# Patient Record
Sex: Male | Born: 1965 | Race: White | Hispanic: No | Marital: Married | Smoking: Former smoker
Health system: Southern US, Community
[De-identification: ages and names within clinical notes are randomized; demographics above are authoritative.]

## PROBLEM LIST (undated history)

## (undated) DIAGNOSIS — E119 Type 2 diabetes mellitus without complications: Secondary | ICD-10-CM

## (undated) HISTORY — PX: SHOULDER SURGERY: SHX246

---

## 2014-01-09 ENCOUNTER — Ambulatory Visit (INDEPENDENT_AMBULATORY_CARE_PROVIDER_SITE_OTHER): Payer: Self-pay

## 2014-01-09 ENCOUNTER — Other Ambulatory Visit: Payer: Self-pay | Admitting: Emergency Medicine

## 2014-01-09 DIAGNOSIS — M25469 Effusion, unspecified knee: Secondary | ICD-10-CM

## 2014-01-09 DIAGNOSIS — R52 Pain, unspecified: Secondary | ICD-10-CM

## 2014-11-17 ENCOUNTER — Emergency Department (INDEPENDENT_AMBULATORY_CARE_PROVIDER_SITE_OTHER)
Admission: EM | Admit: 2014-11-17 | Discharge: 2014-11-17 | Disposition: A | Discharge status: Home / Self Care | Payer: Worker's Compensation | Source: Home / Self Care | Attending: Family Medicine | Admitting: Family Medicine

## 2014-11-17 ENCOUNTER — Emergency Department (INDEPENDENT_AMBULATORY_CARE_PROVIDER_SITE_OTHER): Payer: Worker's Compensation

## 2014-11-17 ENCOUNTER — Encounter: Payer: Self-pay | Admitting: Emergency Medicine

## 2014-11-17 DIAGNOSIS — M12811 Other specific arthropathies, not elsewhere classified, right shoulder: Secondary | ICD-10-CM

## 2014-11-17 DIAGNOSIS — S43401A Unspecified sprain of right shoulder joint, initial encounter: Secondary | ICD-10-CM

## 2014-11-17 MED ORDER — MELOXICAM 15 MG PO TABS: 15.0000 mg | ORAL_TABLET | Freq: Every day | ORAL | Status: DC

## 2014-11-17 MED ORDER — TRAMADOL HCL 50 MG PO TABS: ORAL_TABLET | ORAL | Status: DC
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
## 2014-11-17 NOTE — Discharge Instructions (Signed)
Apply ice pack for 20 to 30 minutes, 3 to 4 times daily  Continue until pain decreases.  Wear shoulder immobilizer.

## 2014-11-17 NOTE — ED Notes (Signed)
Fell at work today and injured right shoulder trying to break the fall.

## 2015-05-12 IMAGING — CR DG KNEE COMPLETE 4+V*R*
4 series · 4 of 4 positions shown · non-contrast
Comparison: None.

CLINICAL DATA: Medial knee pain post injury yesterday

EXAM:
RIGHT KNEE - COMPLETE 4+ VIEW

[view not recorded (1 of 4)]
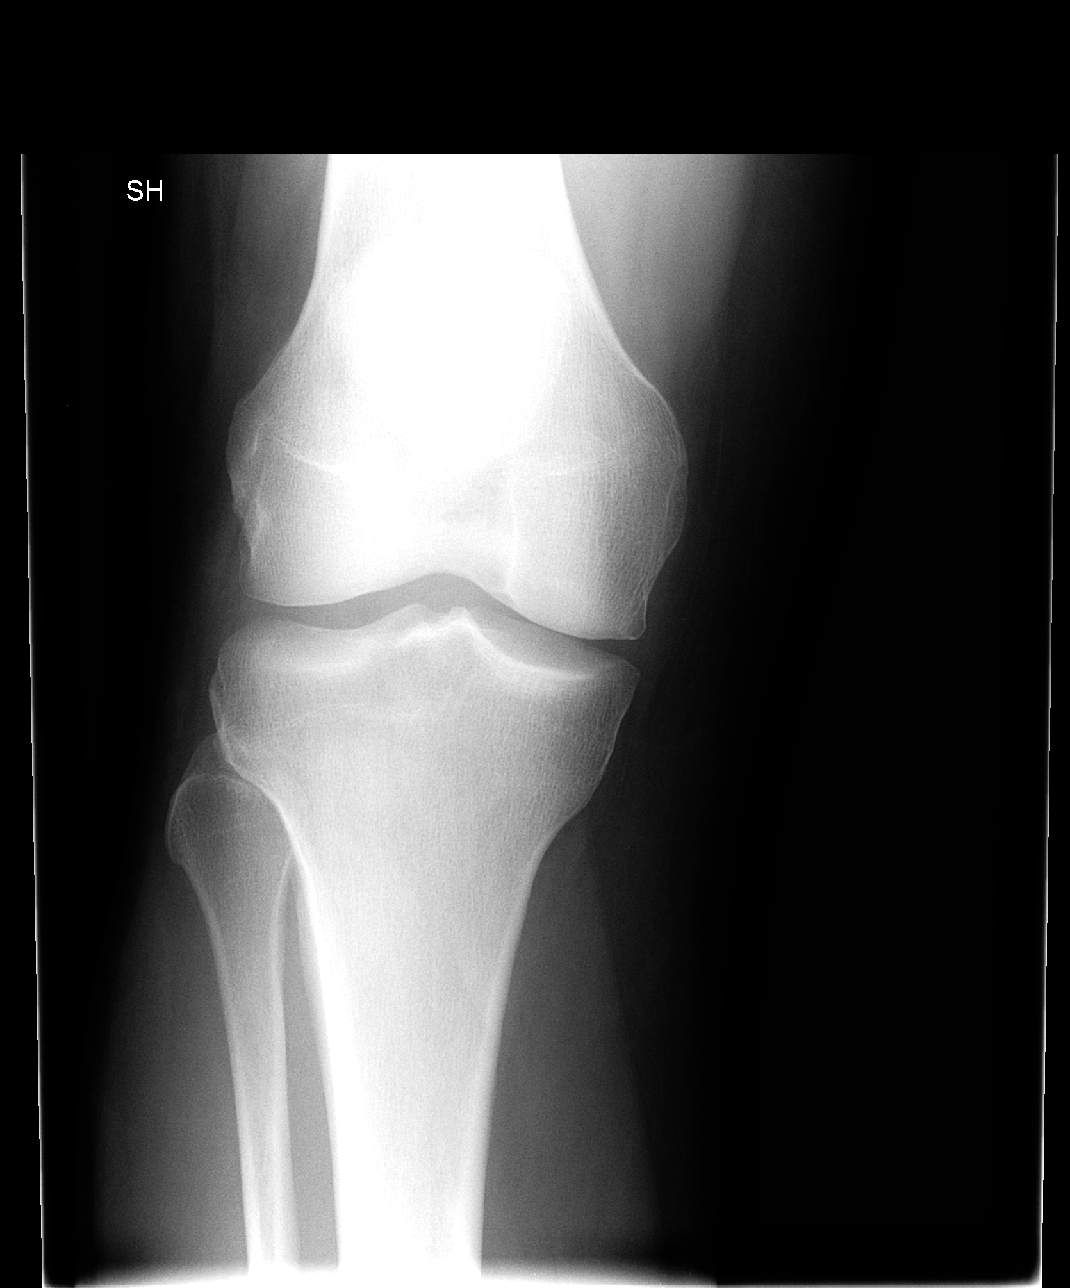

[view not recorded (2 of 4)]
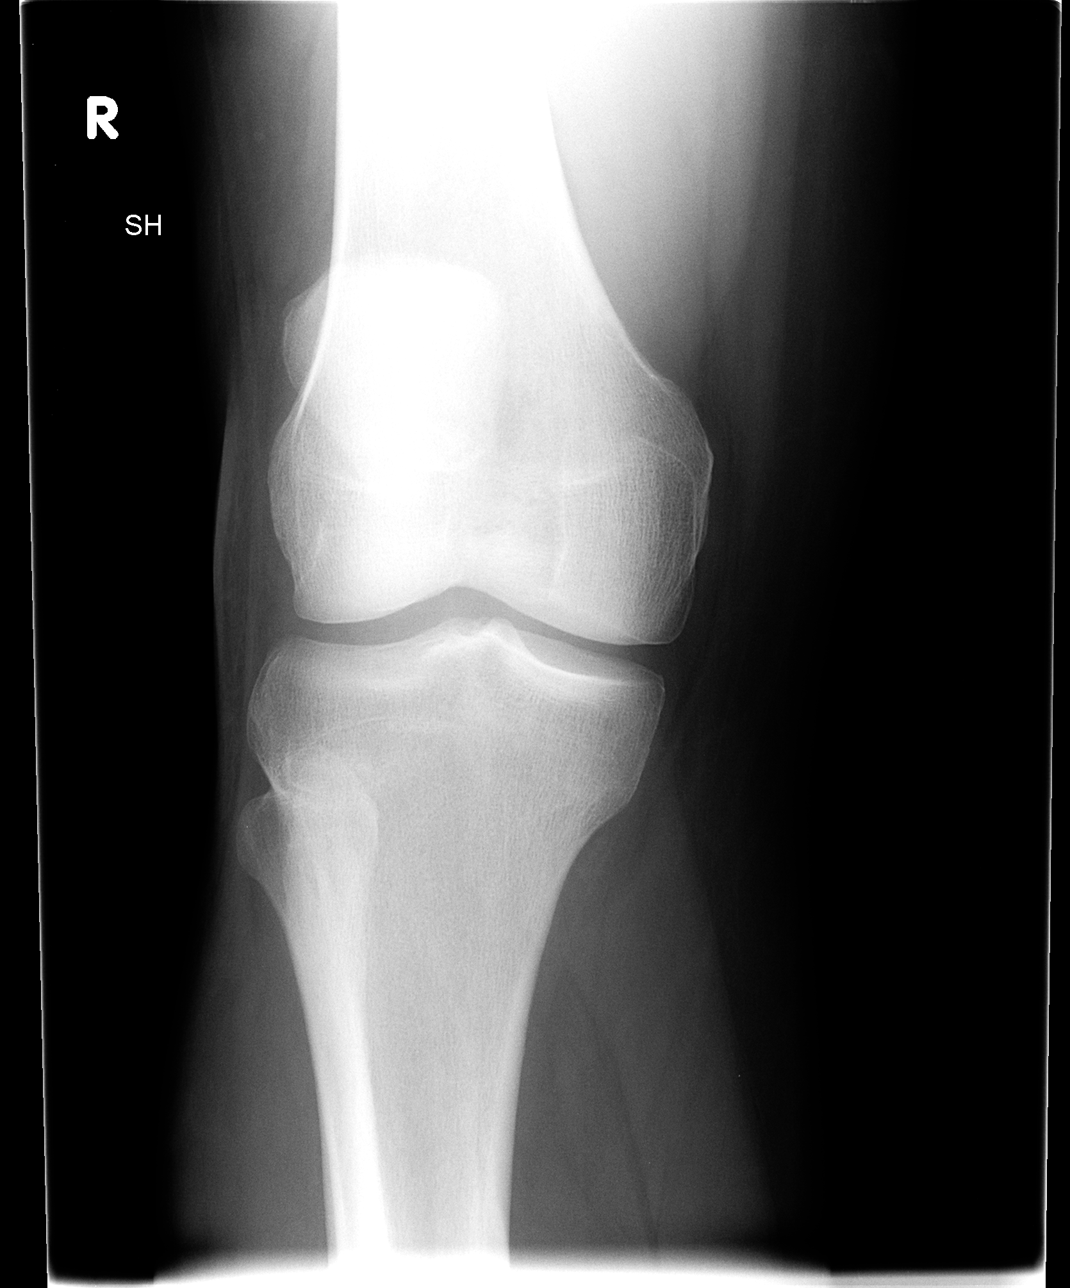

[view not recorded (3 of 4)]
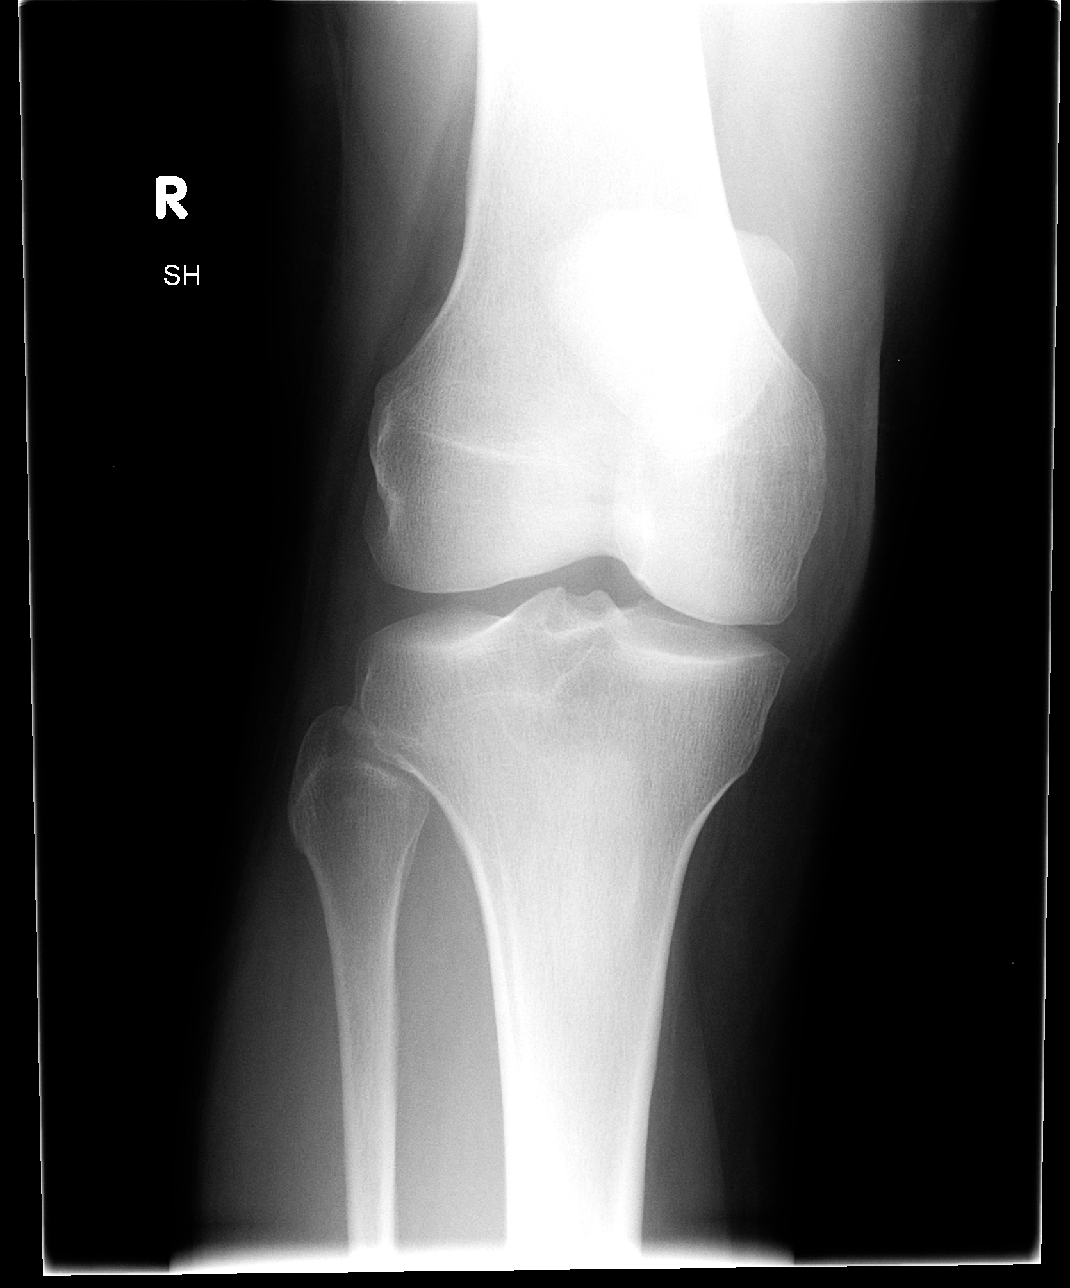

[view not recorded (4 of 4)]
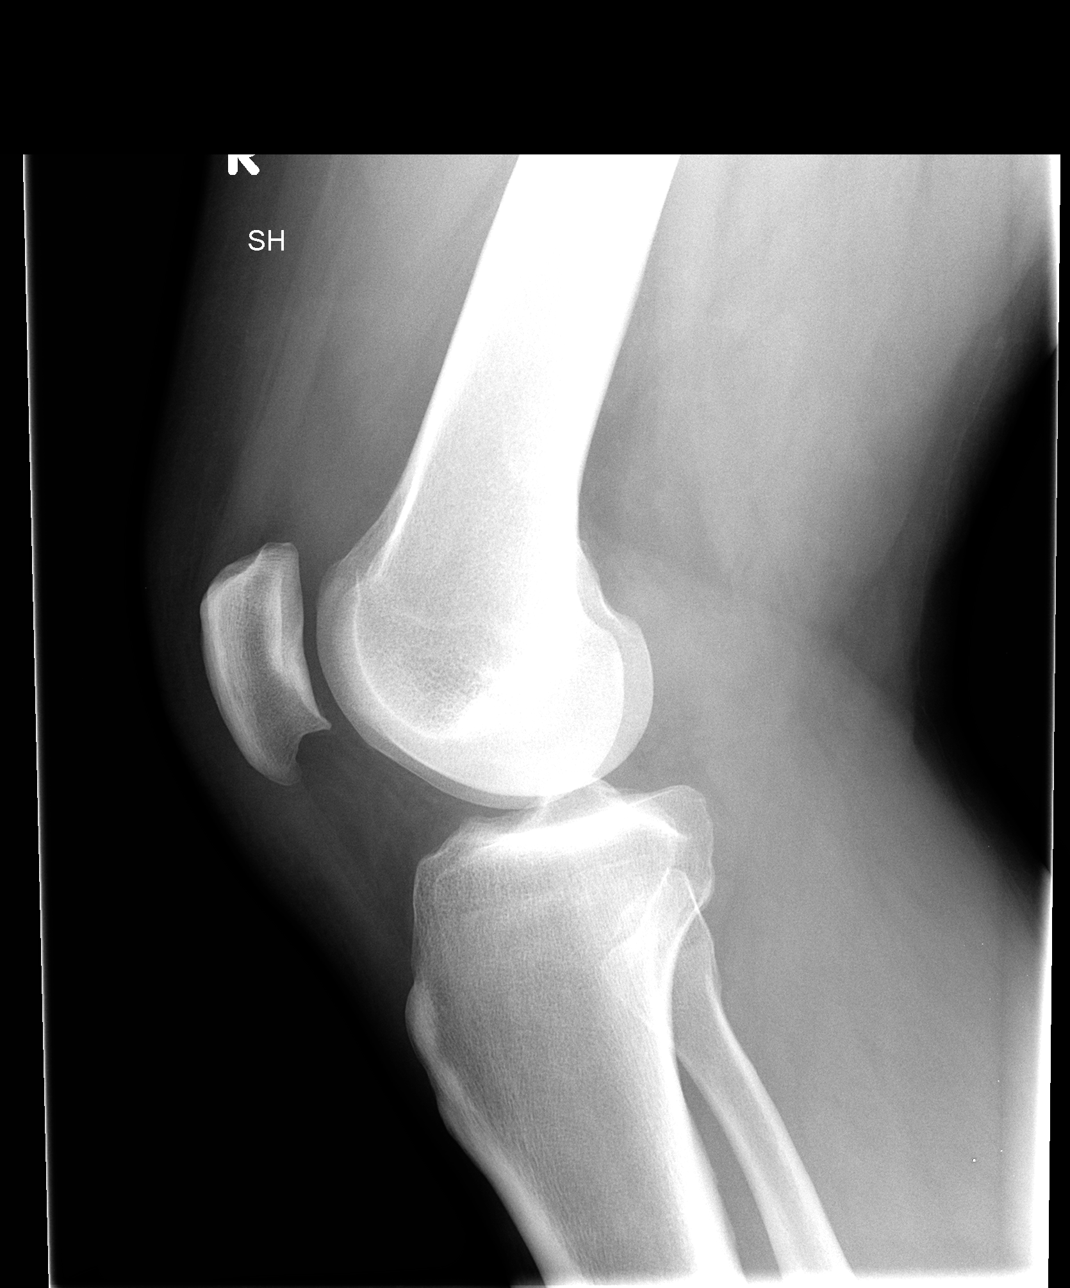

[4 of 4 positions shown; findings below may reference images not displayed]

FINDINGS: Four views of the right knee submitted. No acute fracture or
subluxation. Prepatellar soft tissue swelling. Small joint effusion.
IMPRESSION: No acute fracture or subluxation. Prepatellar soft tissue swelling.
Small joint effusion.

## 2016-03-19 IMAGING — DX DG SHOULDER 2+V*R*
3 series · 3 of 3 positions shown · non-contrast
Comparison: None.

CLINICAL DATA: Right shoulder pain after a fall. Initial encounter.

EXAM:
RIGHT SHOULDER - 2+ VIEW

[shoulder grashey]
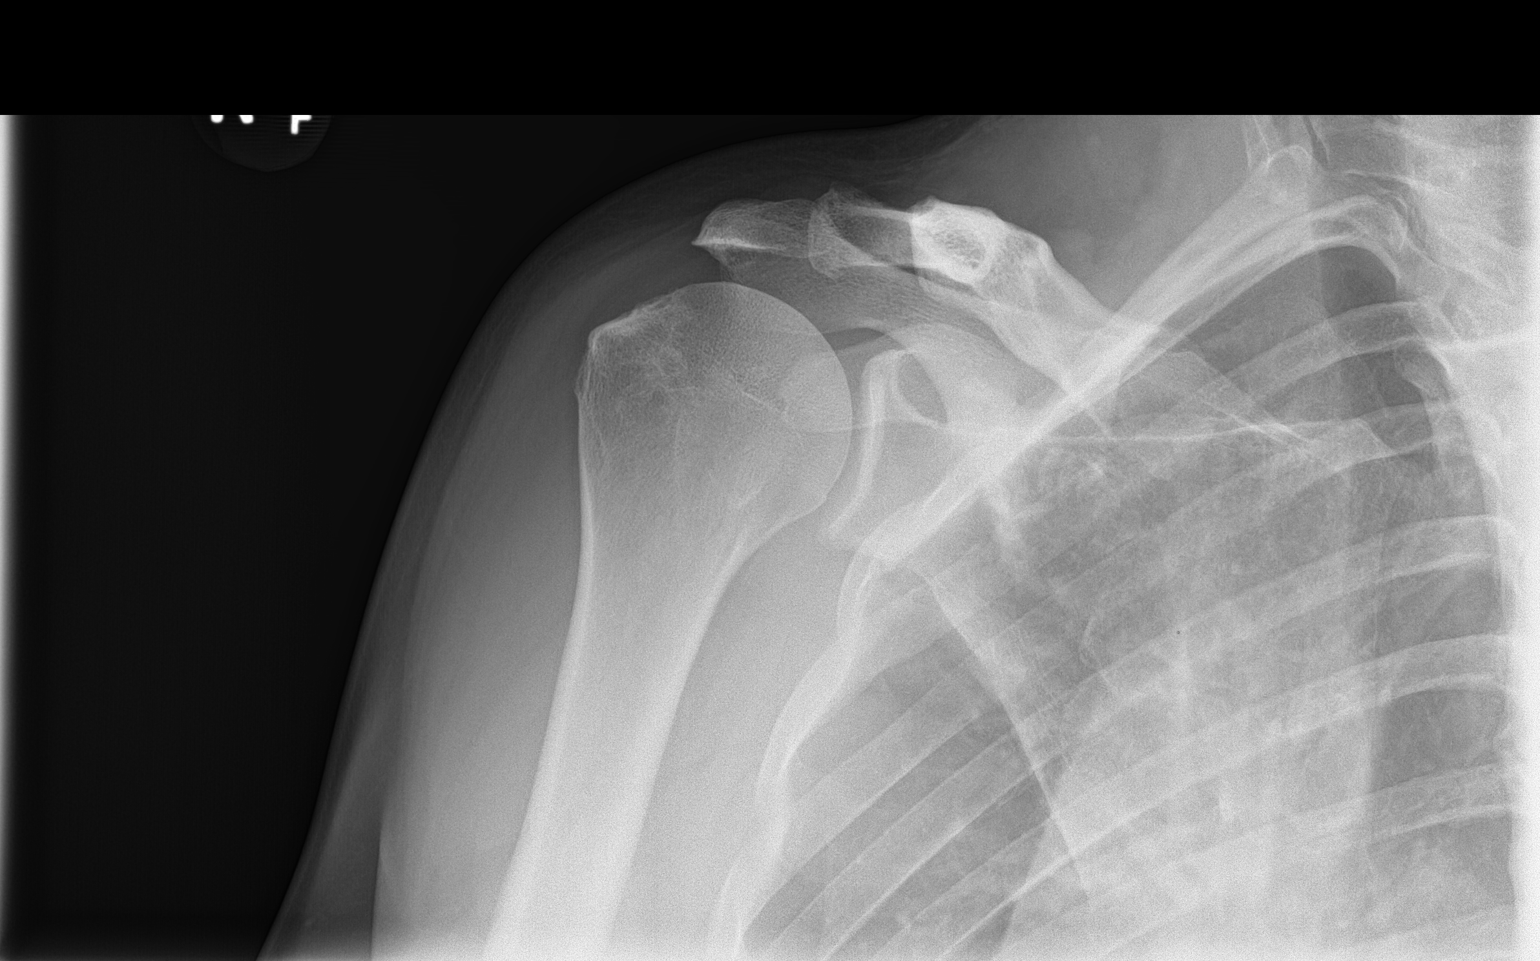

[shoulder y-view]
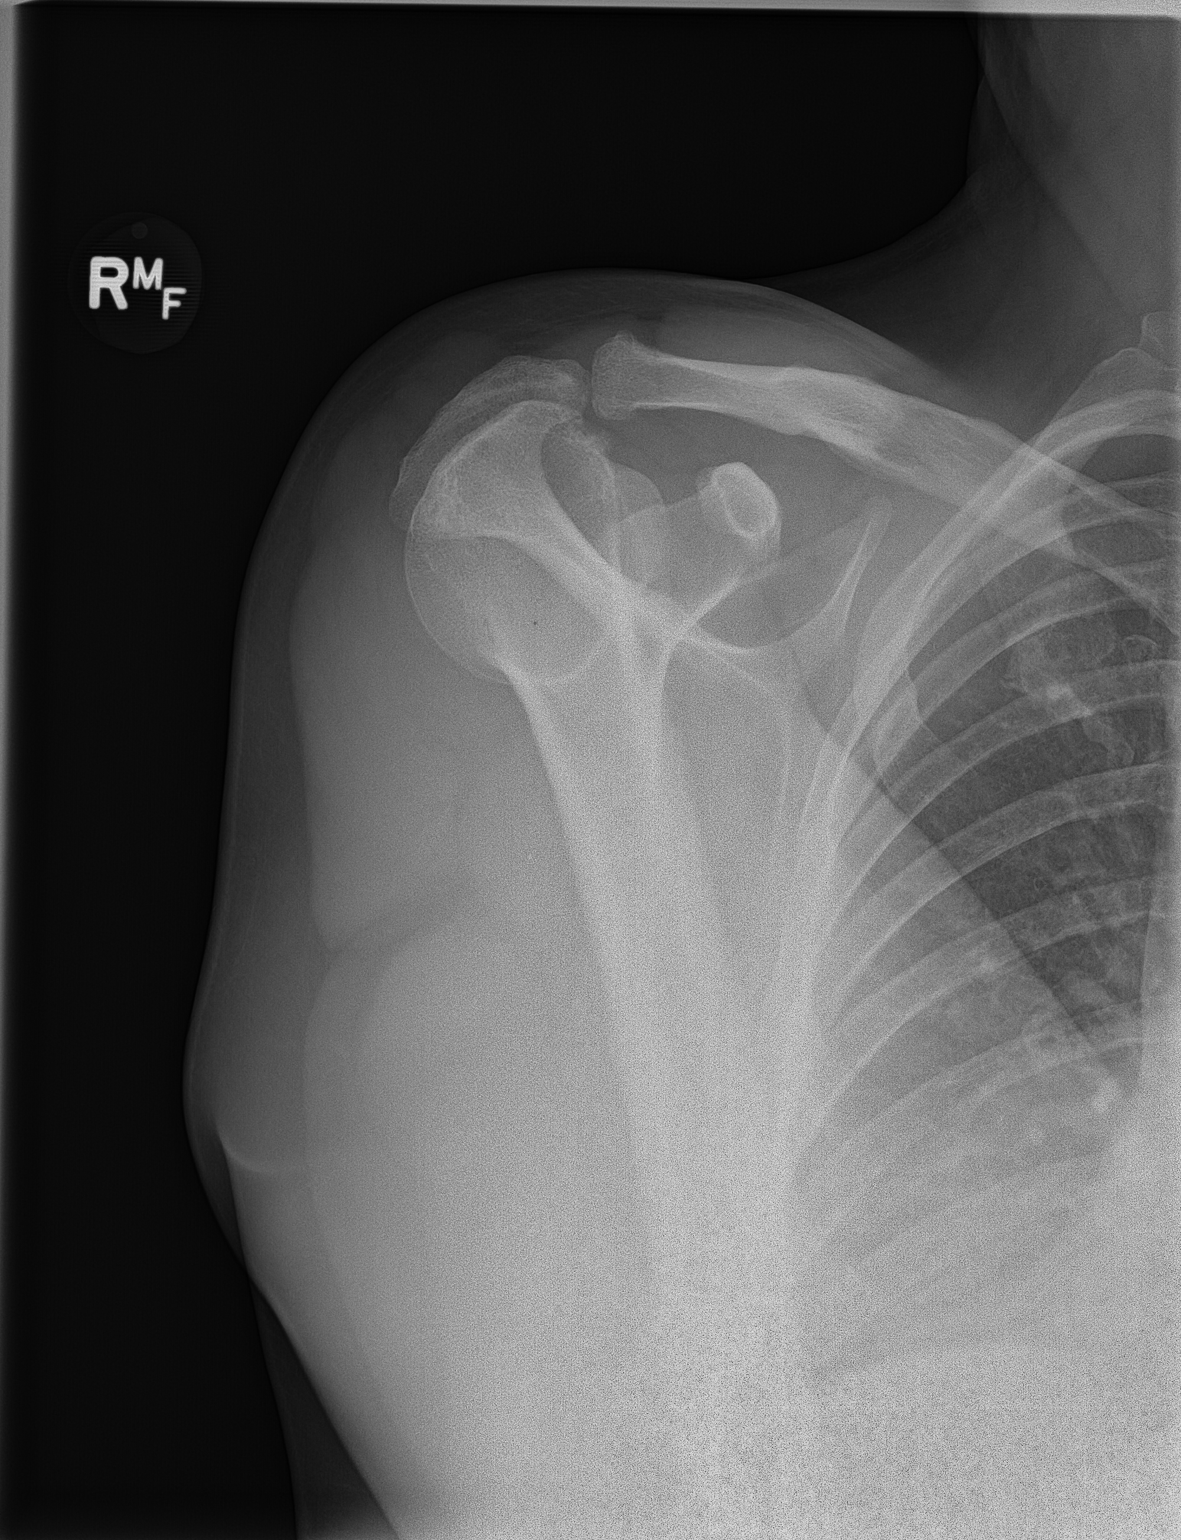

[shoulder axillary]
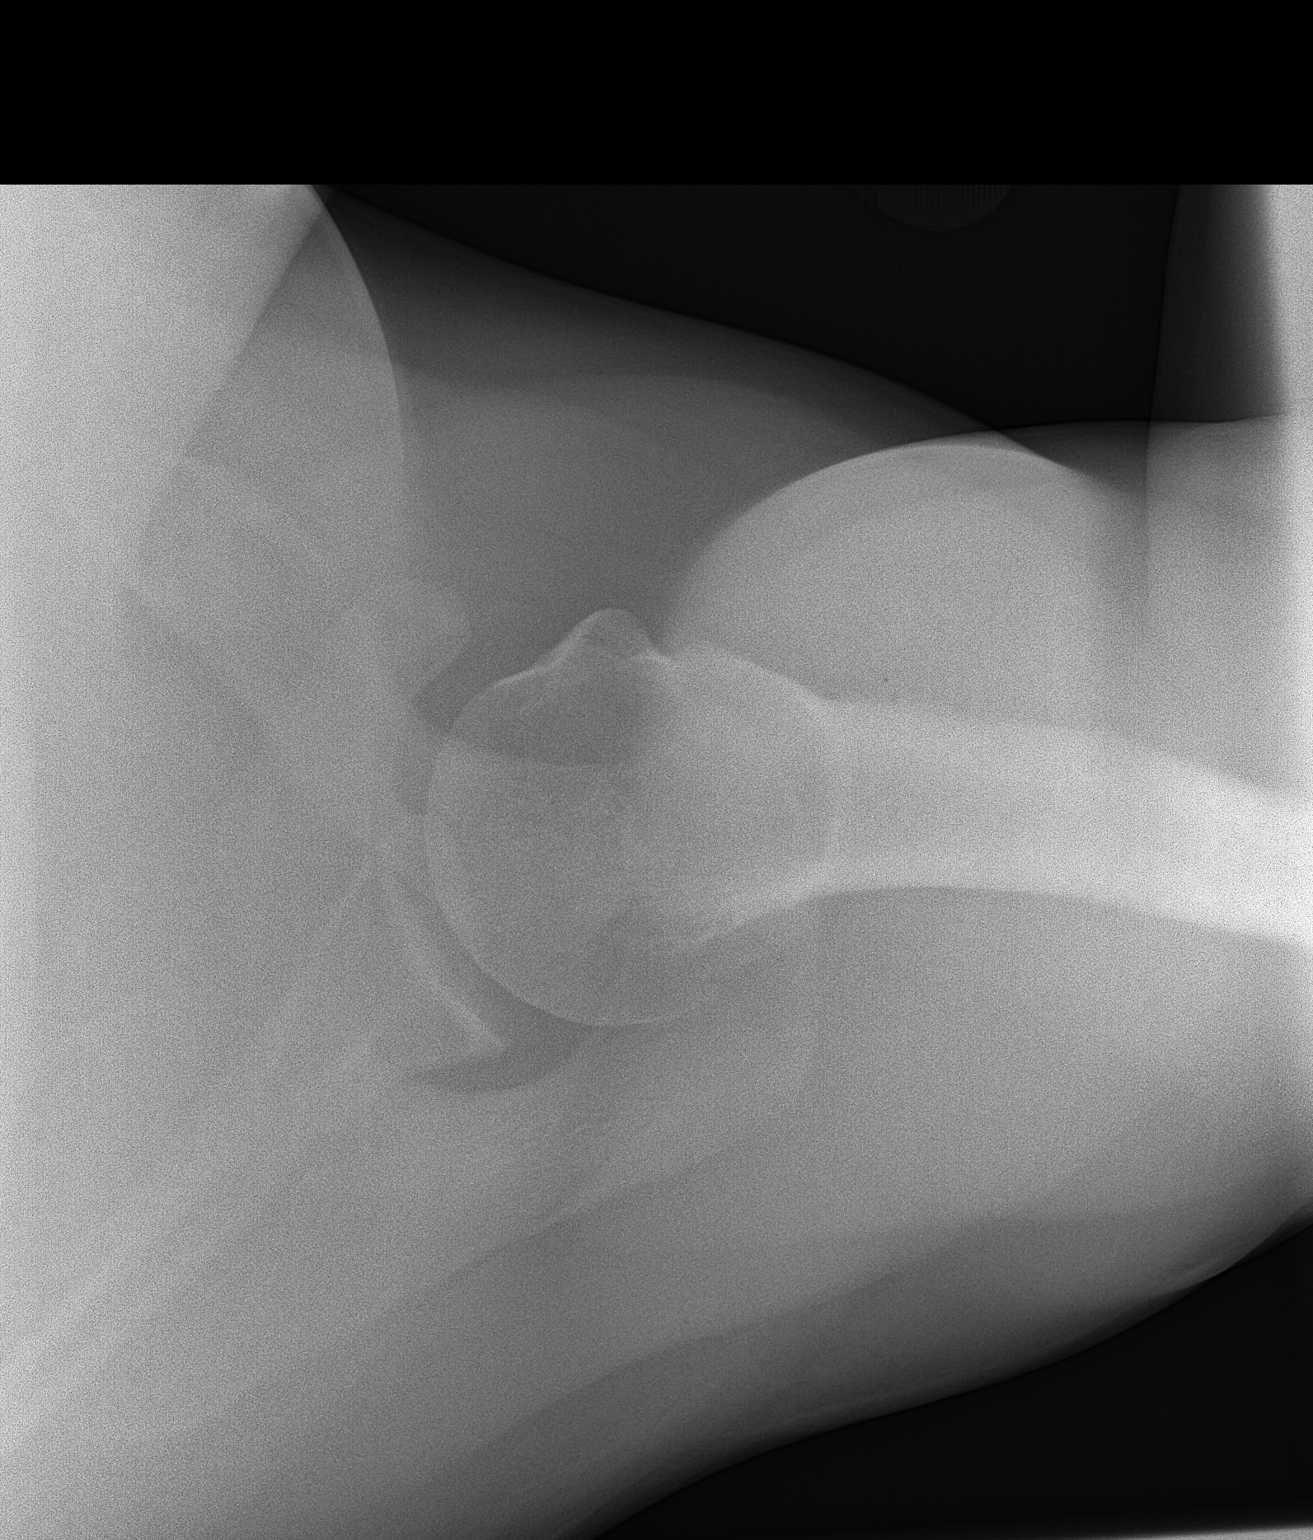

[3 of 3 positions shown; findings below may reference images not displayed]

FINDINGS: Irregular appearance of the clavicle may be projectional or due to a
clavicular injury.

Mild degenerative AC joint spurring. No shoulder dislocation is
identified. No discrete humeral or scapular fracture is seen.
IMPRESSION: 1. Unusual appearance of the clavicle could indicate a clavicular
injury. Consider dedicated clavicle views.
2. Degenerative AC joint arthropathy.

## 2020-09-16 ENCOUNTER — Ambulatory Visit: Payer: Self-pay | Admitting: Surgery

## 2020-09-30 ENCOUNTER — Encounter (HOSPITAL_BASED_OUTPATIENT_CLINIC_OR_DEPARTMENT_OTHER): Payer: Self-pay | Admitting: Surgery

## 2020-09-30 ENCOUNTER — Other Ambulatory Visit: Payer: Self-pay

## 2020-10-05 ENCOUNTER — Other Ambulatory Visit (HOSPITAL_COMMUNITY)
Admission: RE | Admit: 2020-10-05 | Discharge: 2020-10-05 | Disposition: A | Discharge status: Home / Self Care | Payer: Self-pay | Source: Ambulatory Visit | Attending: Surgery | Admitting: Surgery

## 2020-10-05 ENCOUNTER — Encounter (HOSPITAL_BASED_OUTPATIENT_CLINIC_OR_DEPARTMENT_OTHER)
Admission: RE | Admit: 2020-10-05 | Discharge: 2020-10-05 | Disposition: A | Discharge status: Home / Self Care | Payer: Self-pay | Source: Ambulatory Visit | Attending: Surgery | Admitting: Surgery

## 2020-10-05 DIAGNOSIS — Z01812 Encounter for preprocedural laboratory examination: Secondary | ICD-10-CM | POA: Insufficient documentation

## 2020-10-05 DIAGNOSIS — Z01818 Encounter for other preprocedural examination: Secondary | ICD-10-CM | POA: Insufficient documentation

## 2020-10-05 DIAGNOSIS — Z20822 Contact with and (suspected) exposure to covid-19: Secondary | ICD-10-CM | POA: Insufficient documentation

## 2020-10-05 LAB — BASIC METABOLIC PANEL
Anion gap: 10 (ref 5–15)
BUN: 16 mg/dL (ref 6–20)
CO2: 25 mmol/L (ref 22–32)
Calcium: 9.1 mg/dL (ref 8.9–10.3)
Chloride: 100 mmol/L (ref 98–111)
Creatinine, Ser: 0.67 mg/dL (ref 0.61–1.24)
GFR, Estimated: >60 mL/min (ref >60)
Glucose, Bld: 248 mg/dL — ABNORMAL HIGH (ref 70–99)
Potassium: 4.4 mmol/L (ref 3.5–5.1)
Sodium: 135 mmol/L (ref 135–145)

## 2020-10-05 LAB — SARS CORONAVIRUS 2 (TAT 6-24 HRS): SARS Coronavirus 2: NEGATIVE

## 2020-10-05 NOTE — Progress Notes (Signed)

## 2020-10-07 ENCOUNTER — Ambulatory Visit (HOSPITAL_BASED_OUTPATIENT_CLINIC_OR_DEPARTMENT_OTHER): Payer: No Typology Code available for payment source | Admitting: Anesthesiology

## 2020-10-07 ENCOUNTER — Encounter (HOSPITAL_BASED_OUTPATIENT_CLINIC_OR_DEPARTMENT_OTHER): Payer: Self-pay | Admitting: Surgery

## 2020-10-07 ENCOUNTER — Other Ambulatory Visit: Payer: Self-pay

## 2020-10-07 ENCOUNTER — Encounter (HOSPITAL_BASED_OUTPATIENT_CLINIC_OR_DEPARTMENT_OTHER)
Admission: RE | Disposition: A | Discharge status: Home / Self Care | Payer: Self-pay | Source: Home / Self Care | Attending: Surgery

## 2020-10-07 ENCOUNTER — Ambulatory Visit (HOSPITAL_BASED_OUTPATIENT_CLINIC_OR_DEPARTMENT_OTHER)
Admission: RE | Admit: 2020-10-07 | Discharge: 2020-10-07 | Disposition: A | Discharge status: Home / Self Care | Payer: No Typology Code available for payment source | Attending: Surgery | Admitting: Surgery

## 2020-10-07 DIAGNOSIS — Z87891 Personal history of nicotine dependence: Secondary | ICD-10-CM | POA: Diagnosis not present

## 2020-10-07 DIAGNOSIS — Z7984 Long term (current) use of oral hypoglycemic drugs: Secondary | ICD-10-CM | POA: Diagnosis not present

## 2020-10-07 DIAGNOSIS — K429 Umbilical hernia without obstruction or gangrene: Secondary | ICD-10-CM | POA: Insufficient documentation

## 2020-10-07 HISTORY — DX: Type 2 diabetes mellitus without complications: E11.9

## 2020-10-07 HISTORY — PX: UMBILICAL HERNIA REPAIR: SHX196

## 2020-10-07 LAB — GLUCOSE, CAPILLARY
Glucose-Capillary: 158 mg/dL — ABNORMAL HIGH (ref 70–99)
Glucose-Capillary: 141 mg/dL — ABNORMAL HIGH (ref 70–99)

## 2020-10-07 SURGERY — REPAIR, HERNIA, UMBILICAL, ADULT
Anesthesia: General | Site: Abdomen

## 2020-10-07 MED ORDER — PHENYLEPHRINE HCL (PRESSORS) 10 MG/ML IV SOLN
INTRAVENOUS | Status: DC | PRN
  Administered 2020-10-07: 80 ug via INTRAVENOUS

## 2020-10-07 MED ORDER — GABAPENTIN 300 MG PO CAPS
ORAL_CAPSULE | ORAL | Status: AC
  Filled 2020-10-07: qty 1

## 2020-10-07 MED ORDER — DEXAMETHASONE SODIUM PHOSPHATE 10 MG/ML IJ SOLN
INTRAMUSCULAR | Status: AC
  Filled 2020-10-07: qty 1

## 2020-10-07 MED ORDER — LACTATED RINGERS IV SOLN: INTRAVENOUS | Status: DC

## 2020-10-07 MED ORDER — PROMETHAZINE HCL 25 MG/ML IJ SOLN: 6.2500 mg | INTRAMUSCULAR | Status: DC | PRN

## 2020-10-07 MED ORDER — PROPOFOL 500 MG/50ML IV EMUL
INTRAVENOUS | Status: AC
  Filled 2020-10-07: qty 50

## 2020-10-07 MED ORDER — ENOXAPARIN SODIUM 40 MG/0.4ML ~~LOC~~ SOLN: 40.0000 mg | Freq: Once | SUBCUTANEOUS | Status: DC

## 2020-10-07 MED ORDER — ACETAMINOPHEN 500 MG PO TABS: 1000.0000 mg | ORAL_TABLET | Freq: Four times a day (QID) | ORAL | 0 refills | Status: AC | PRN

## 2020-10-07 MED ORDER — ACETAMINOPHEN 500 MG PO TABS
1000.0000 mg | ORAL_TABLET | ORAL | Status: AC
  Administered 2020-10-07: 1000 mg via ORAL

## 2020-10-07 MED ORDER — ROCURONIUM BROMIDE 100 MG/10ML IV SOLN
INTRAVENOUS | Status: DC | PRN
  Administered 2020-10-07: 80 mg via INTRAVENOUS

## 2020-10-07 MED ORDER — DEXAMETHASONE SODIUM PHOSPHATE 4 MG/ML IJ SOLN
INTRAMUSCULAR | Status: DC | PRN
  Administered 2020-10-07: 5 mg via INTRAVENOUS

## 2020-10-07 MED ORDER — ONDANSETRON HCL 4 MG/2ML IJ SOLN
INTRAMUSCULAR | Status: AC
  Filled 2020-10-07: qty 2

## 2020-10-07 MED ORDER — CEFAZOLIN SODIUM-DEXTROSE 2-4 GM/100ML-% IV SOLN
INTRAVENOUS | Status: AC
  Filled 2020-10-07: qty 100

## 2020-10-07 MED ORDER — BUPIVACAINE HCL (PF) 0.25 % IJ SOLN
INTRAMUSCULAR | Status: DC | PRN
  Administered 2020-10-07: 30 mL

## 2020-10-07 MED ORDER — SUGAMMADEX SODIUM 500 MG/5ML IV SOLN
INTRAVENOUS | Status: DC | PRN
  Administered 2020-10-07: 500 mg via INTRAVENOUS

## 2020-10-07 MED ORDER — BUPIVACAINE LIPOSOME 1.3 % IJ SUSP: 20.0000 mL | Freq: Once | INTRAMUSCULAR | Status: DC

## 2020-10-07 MED ORDER — LIDOCAINE 2% (20 MG/ML) 5 ML SYRINGE
INTRAMUSCULAR | Status: AC
  Filled 2020-10-07: qty 5

## 2020-10-07 MED ORDER — IBUPROFEN 800 MG PO TABS: 800.0000 mg | ORAL_TABLET | Freq: Three times a day (TID) | ORAL | 0 refills | Status: AC

## 2020-10-07 MED ORDER — FENTANYL CITRATE (PF) 100 MCG/2ML IJ SOLN
INTRAMUSCULAR | Status: AC
  Filled 2020-10-07: qty 2

## 2020-10-07 MED ORDER — CEFAZOLIN SODIUM-DEXTROSE 2-4 GM/100ML-% IV SOLN
2.0000 g | INTRAVENOUS | Status: AC
  Administered 2020-10-07: 2 g via INTRAVENOUS

## 2020-10-07 MED ORDER — MIDAZOLAM HCL 2 MG/2ML IJ SOLN
INTRAMUSCULAR | Status: AC
  Filled 2020-10-07: qty 2

## 2020-10-07 MED ORDER — OXYCODONE HCL 5 MG/5ML PO SOLN: 5.0000 mg | Freq: Once | ORAL | Status: DC | PRN

## 2020-10-07 MED ORDER — CELECOXIB 200 MG PO CAPS
400.0000 mg | ORAL_CAPSULE | ORAL | Status: AC
  Administered 2020-10-07: 400 mg via ORAL

## 2020-10-07 MED ORDER — CHLORHEXIDINE GLUCONATE CLOTH 2 % EX PADS: 6.0000 | MEDICATED_PAD | Freq: Once | CUTANEOUS | Status: DC

## 2020-10-07 MED ORDER — ONDANSETRON HCL 4 MG/2ML IJ SOLN
INTRAMUSCULAR | Status: DC | PRN
  Administered 2020-10-07: 4 mg via INTRAVENOUS

## 2020-10-07 MED ORDER — MIDAZOLAM HCL 5 MG/5ML IJ SOLN
INTRAMUSCULAR | Status: DC | PRN
  Administered 2020-10-07: 2 mg via INTRAVENOUS

## 2020-10-07 MED ORDER — ROCURONIUM BROMIDE 10 MG/ML (PF) SYRINGE
PREFILLED_SYRINGE | INTRAVENOUS | Status: AC
  Filled 2020-10-07: qty 10

## 2020-10-07 MED ORDER — OXYCODONE HCL 5 MG PO TABS: 5.0000 mg | ORAL_TABLET | Freq: Once | ORAL | Status: DC | PRN

## 2020-10-07 MED ORDER — PROPOFOL 10 MG/ML IV BOLUS
INTRAVENOUS | Status: DC | PRN
  Administered 2020-10-07: 150 mg via INTRAVENOUS

## 2020-10-07 MED ORDER — OXYCODONE-ACETAMINOPHEN 5-325 MG PO TABS: 1.0000 | ORAL_TABLET | ORAL | 0 refills | Status: AC | PRN

## 2020-10-07 MED ORDER — GABAPENTIN 300 MG PO CAPS
300.0000 mg | ORAL_CAPSULE | ORAL | Status: AC
  Administered 2020-10-07: 300 mg via ORAL

## 2020-10-07 MED ORDER — FENTANYL CITRATE (PF) 100 MCG/2ML IJ SOLN: 25.0000 ug | INTRAMUSCULAR | Status: DC | PRN

## 2020-10-07 MED ORDER — ACETAMINOPHEN 500 MG PO TABS
ORAL_TABLET | ORAL | Status: AC
  Filled 2020-10-07: qty 2

## 2020-10-07 MED ORDER — CELECOXIB 200 MG PO CAPS
ORAL_CAPSULE | ORAL | Status: AC
  Filled 2020-10-07: qty 2

## 2020-10-07 MED ORDER — FENTANYL CITRATE (PF) 100 MCG/2ML IJ SOLN
INTRAMUSCULAR | Status: DC | PRN
  Administered 2020-10-07: 100 ug via INTRAVENOUS

## 2020-10-07 MED ORDER — LIDOCAINE HCL (CARDIAC) PF 100 MG/5ML IV SOSY
PREFILLED_SYRINGE | INTRAVENOUS | Status: DC | PRN
  Administered 2020-10-07: 100 mg via INTRAVENOUS

## 2020-10-07 SURGICAL SUPPLY — 48 items
ADH SKN CLS APL DERMABOND .7 (GAUZE/BANDAGES/DRESSINGS) ×1
APL PRP STRL LF DISP 70% ISPRP (MISCELLANEOUS) ×1
APL SKNCLS STERI-STRIP NONHPOA (GAUZE/BANDAGES/DRESSINGS)
BENZOIN TINCTURE PRP APPL 2/3 (GAUZE/BANDAGES/DRESSINGS) IMPLANT
BLADE CLIPPER SURG (BLADE) ×2 IMPLANT
BLADE SURG 15 STRL LF DISP TIS (BLADE) ×1 IMPLANT
BLADE SURG 15 STRL SS (BLADE) ×2
CHLORAPREP W/TINT 26 (MISCELLANEOUS) ×2 IMPLANT
COVER BACK TABLE 60X90IN (DRAPES) ×2 IMPLANT
COVER MAYO STAND STRL (DRAPES) ×2 IMPLANT
COVER WAND RF STERILE (DRAPES) IMPLANT
DECANTER SPIKE VIAL GLASS SM (MISCELLANEOUS) IMPLANT
DERMABOND ADVANCED (GAUZE/BANDAGES/DRESSINGS) ×1
DERMABOND ADVANCED .7 DNX12 (GAUZE/BANDAGES/DRESSINGS) ×1 IMPLANT
DRAPE LAPAROTOMY 100X72 PEDS (DRAPES) ×2 IMPLANT
DRAPE UTILITY XL STRL (DRAPES) ×2 IMPLANT
DRSG TEGADERM 4X4.75 (GAUZE/BANDAGES/DRESSINGS) IMPLANT
ELECT COATED BLADE 2.86 ST (ELECTRODE) ×2 IMPLANT
ELECT REM PT RETURN 9FT ADLT (ELECTROSURGICAL) ×2
ELECTRODE REM PT RTRN 9FT ADLT (ELECTROSURGICAL) ×1 IMPLANT
GLOVE BIOGEL PI IND STRL 7.0 (GLOVE) ×2 IMPLANT
GLOVE BIOGEL PI INDICATOR 7.0 (GLOVE) ×2
GLOVE ECLIPSE 6.5 STRL STRAW (GLOVE) ×2 IMPLANT
GLOVE SURG ENC MOIS LTX SZ7 (GLOVE) ×2 IMPLANT
GLOVE SURG UNDER POLY LF SZ7.5 (GLOVE) ×2 IMPLANT
GOWN STRL REUS W/ TWL LRG LVL3 (GOWN DISPOSABLE) ×1 IMPLANT
GOWN STRL REUS W/ TWL XL LVL3 (GOWN DISPOSABLE) ×1 IMPLANT
GOWN STRL REUS W/TWL LRG LVL3 (GOWN DISPOSABLE) ×2
GOWN STRL REUS W/TWL XL LVL3 (GOWN DISPOSABLE) ×2
MESH ULTRAPRO 3X6 7.6X15CM (Mesh General) ×2 IMPLANT
NEEDLE HYPO 22GX1.5 SAFETY (NEEDLE) ×2 IMPLANT
NS IRRIG 1000ML POUR BTL (IV SOLUTION) ×2 IMPLANT
PACK BASIN DAY SURGERY FS (CUSTOM PROCEDURE TRAY) ×2 IMPLANT
PENCIL SMOKE EVACUATOR (MISCELLANEOUS) ×2 IMPLANT
SLEEVE SCD COMPRESS KNEE MED (MISCELLANEOUS) ×2 IMPLANT
SPONGE LAP 4X18 RFD (DISPOSABLE) ×2 IMPLANT
STRIP CLOSURE SKIN 1/2X4 (GAUZE/BANDAGES/DRESSINGS) IMPLANT
SUT ETHIBOND 0 MO6 C/R (SUTURE) IMPLANT
SUT MNCRL AB 4-0 PS2 18 (SUTURE) ×2 IMPLANT
SUT NOVA NAB DX-16 0-1 5-0 T12 (SUTURE) ×2 IMPLANT
SUT VIC AB 0 SH 27 (SUTURE) IMPLANT
SUT VIC AB 2-0 SH 27 (SUTURE)
SUT VIC AB 2-0 SH 27XBRD (SUTURE) IMPLANT
SUT VIC AB 3-0 SH 27 (SUTURE) ×2
SUT VIC AB 3-0 SH 27X BRD (SUTURE) ×1 IMPLANT
SUT VICRYL AB 3 0 TIES (SUTURE) IMPLANT
SYR CONTROL 10ML LL (SYRINGE) ×2 IMPLANT
TOWEL GREEN STERILE FF (TOWEL DISPOSABLE) ×2 IMPLANT

## 2020-10-07 NOTE — Transfer of Care (Signed)
Immediate Anesthesia Transfer of Care Note  Patient: Johnathan Morgan  Procedure(s) Performed: UMBILICAL HERNIA REPAIR WITH MESH (N/A Abdomen)  Patient Location: PACU  Anesthesia Type:General  Level of Consciousness: sedated  Airway & Oxygen Therapy: Patient Spontanous Breathing and Patient connected to face mask oxygen  Post-op Assessment: Report given to RN and Post -op Vital signs reviewed and stable  Post vital signs: Reviewed and stable  Last Vitals:  Vitals Value Taken Time  BP 145/88 10/07/20 1100  Temp    Pulse 105 10/07/20 1101  Resp 33 10/07/20 1101  SpO2 93 % 10/07/20 1101  Vitals shown include unvalidated device data.  Last Pain:  Vitals:   10/07/20 0828  TempSrc: Oral  PainSc: 0-No pain      Patients Stated Pain Goal: 5 (10/07/20 8403)  Complications: No complications documented.

## 2020-10-07 NOTE — H&P (Signed)
   Admitting Physician: Hyman Hopes Felesha Moncrieffe  Service: General surgery  CC: Umbilical hernia  Subjective   HPI: Johnathan Morgan is an 55 y.o. male who is here for elective umbilical hernia repair  Past Medical History:  Diagnosis Date  . Diabetes mellitus without complication [facility])     Past Surgical History:  Procedure Laterality Date  . SHOULDER SURGERY     left rotator cuff injury    History reviewed. No pertinent family history.  Social:  reports that he has quit smoking. He does not have any smokeless tobacco history on file. He reports that he does not drink alcohol and does not use drugs.  Allergies: No Known Allergies  Medications: Current Outpatient Medications  Medication Instructions  . METFORMIN HCL PO Oral    ROS - all of the below systems have been reviewed with the patient and positives are indicated with bold text General: chills, fever or night sweats Eyes: blurry vision or double vision ENT: epistaxis or sore throat Allergy/Immunology: itchy/watery eyes or nasal congestion Hematologic/Lymphatic: bleeding problems, blood clots or swollen lymph nodes Endocrine: temperature intolerance or unexpected weight changes Breast: new or changing breast lumps or nipple discharge Resp: cough, shortness of breath, or wheezing CV: chest pain or dyspnea on exertion GI: as per HPI GU: dysuria, trouble voiding, or hematuria MSK: joint pain or joint stiffness Neuro: TIA or stroke symptoms Derm: pruritus and skin lesion changes Psych: anxiety and depression  Objective   PE Blood pressure 129/84, pulse 89, temperature 98.3 F (36.8 C), temperature source Oral, resp. rate 18, height 6\' 2"  (1.88 m), weight 119.5 kg, SpO2 99 %. Constitutional: NAD; conversant; no deformities Eyes: Moist conjunctiva; no lid lag; anicteric; PERRL Neck: Trachea midline; no thyromegaly Lungs: Normal respiratory effort; no tactile fremitus CV: RRR; no palpable thrills; no pitting  edema GI: Abd palpable umbilical hernia; no palpable hepatosplenomegaly MSK: Normal range of motion of extremities; no clubbing/cyanosis Psychiatric: Appropriate affect; alert and oriented x3 Lymphatic: No palpable cervical or axillary lymphadenopathy  Results for orders placed or performed during the hospital encounter of 10/07/20 (from the past 24 hour(s))  Glucose, capillary     Status: Abnormal   Collection Time: 10/07/20  8:41 AM  Result Value Ref Range   Glucose-Capillary 158 (H) 70 - 99 mg/dL    Imaging Orders  No imaging studies ordered today     Assessment and Plan   Johnathan Morgan is an 55 y.o. male with umbilical hernia.  The risks, benefits, and alternatives of repair were discussed with patient.  He agreed and consent to proceed.  We will proceed to the operating room as scheduled.  40, MD  [facility] Use [email or web address] to contact on call provider

## 2020-10-07 NOTE — Discharge Instructions (Signed)
VENTRAL HERNIA REPAIR POST OPERATIVE INSTRUCTIONS  Thinking Clearly  . The anesthesia may cause you to feel different for 1 or 2 days. Do not drive, drink alcohol, or make any big decisions for at least 2 days.  Nutrition . When you wake up, you will be able to drink small amounts of liquid. If you do not feel sick, you can slowly advance your diet to regular foods. . Continue to drink lots of fluids, usually about 8 to 10 glasses per day. . Eat a high-fiber diet so you don't strain during bowel movements. . High-Fiber Foods o Foods high in fiber include beans, bran cereals and whole-grain breads, peas, dried fruit (figs, apricots, and dates), raspberries, blackberries, strawberries, sweet corn, broccoli, baked potatoes with skin, plums, pears, apples, greens, and nuts. Activity . Slowly increase your activity. Be sure to get up and walk every hour or so to prevent blood clots. . No heavy lifting or strenuous activity for 4 weeks following surgery to prevent hernias at your incision sites or recurrence of your hernia. . It is normal to feel tired. You may need more sleep than usual.  Get your rest but make sure to get up and move around frequently to prevent blood clots and pneumonia.  Work and Return to [employer] . You can go back to work when you feel well enough. Discuss the timing with your surgeon. . You can usually go back to school or work 1 week or less after an laparoscopic or an open repair. . If your work requires heavy lifting or strenuous activity you need to be placed on light duty for 4 weeks following surgery. . You can return to gym class, sports or other physical activities 4 weeks after surgery.  Wound Care . You may experience significant bruising throughout the abdominal wall that may track down into the groin including into the scrotum in males.  Rest, elevating the groin and scrotum above the level of the heart, ice and compression with tight fitting underwear or an  abdominal binder can help.  . Always wash your hands before and after touching near your incision site. . Do not soak in a bathtub until cleared at your follow up appointment. You may take a shower 24 hours after surgery. . A small amount of drainage from the incision is normal. If the drainage is thick and yellow or the site is red, you may have an infection, so call your surgeon. . If you have a drain in one of your incisions, it will be taken out in office when the drainage stops. . Steri-Strips will fall off in 7 to 10 days or they will be removed during your first office visit. . If you have dermabond glue covering over the incision, allow the glue to flake off on its own. . Protect the new skin, especially from the sun. The sun can burn and cause darker scarring. . Your scar will heal in about 4 to 6 weeks and will become softer and continue to fade over the next year.  The cosmetic appearance of the incisions will improve over the course of the first year after surgery. . Sensation around your incision will return in a few weeks or months.  Bowel Movements . After intestinal surgery, you may have loose watery stools for several days. If watery diarrhea lasts longer than 3 days, contact your surgeon. . Pain medication (narcotics) can cause constipation. Increase the fiber in your diet with high-fiber foods if you are  constipated. You can take an over the counter stool softener like Colace to avoid constipation.  Additional over the counter medications can also be used if Colace isn't sufficient (for example, Milk of Magnesia or Miralax).  Pain . The amount of pain is different for each person. Some people need only 1 to 3 doses of pain control medication, while others need more. . Take alternating doses of tylenol and ibuprofen around the clock for the first five days following surgery.  This will provide a baseline of pain control and help with inflammation.  Take the narcotic pain medication  in addition if needed for severe pain.  Contact Your Surgeon at [phone], if you have: . Pain that will not go away . Pain that gets worse . A fever of more than 101F (38.3C) . Repeated vomiting . Swelling, redness, bleeding, or bad-smelling drainage from your wound site . Strong abdominal pain . No bowel movement or unable to pass gas for 3 days . Watery diarrhea lasting longer than 3 days  Pain Control . The goal of pain control is to minimize pain, keep you moving and help you heal. Your surgical team will work with you on your pain plan. Most often a combination of therapies and medications are used to control your pain. You may also be given medication (local anesthetic) at the surgical site. This may help control your pain for several days. . Extreme pain puts extra stress on your body at a time when your body needs to focus on healing. Do not wait until your pain has reached a level "10" or is unbearable before telling your doctor or nurse. It is much easier to control pain before it becomes severe. . Following a laparoscopic procedure, pain is sometimes felt in the shoulder. This is due to the gas inserted into your abdomen during the procedure. Moving and walking helps to decrease the gas and the right shoulder pain.  . Use the guide below for ways to manage your post-operative pain. Learn more by going to facs.org/safepaincontrol.  How Intense Is My Pain Common Therapies to Feel Better       I hardly notice my pain, and it does not interfere with my activities.  I notice my pain and it distracts me, but I can still do activities (sitting up, walking, standing).  Non-Medication Therapies  Ice (in a bag, applied over clothing at the surgical site), elevation, rest, meditation, massage, distraction (music, TV, play) walking and mild exercise Splinting the abdomen with pillows +  Non-Opioid Medications Acetaminophen (Tylenol) Non-steroidal anti-inflammatory drugs  (NSAIDS) Aspirin, Ibuprofen (Motrin, Advil) Naproxen (Aleve) Take these as needed, when you feel pain. Both acetaminophen and NSAIDs help to decrease pain and swelling (inflammation).      My pain is hard to ignore and is more noticeable even when I rest.  My pain interferes with my usual activities.  Non-Medication Therapies  +  Non-Opioid medications  Take on a regular schedule (around-the-clock) instead of as needed. (For example, Tylenol every 6 hours at 9:00 am, 3:00 pm, 9:00 pm, 3:00 am and Motrin every 6 hours at 12:00 am, 6:00 am, 12:00 pm, 6:00 pm)         I am focused on my pain, and I am not doing my daily activities.  I am groaning in pain, and I cannot sleep. I am unable to do anything.  My pain is as bad as it could be, and nothing else matters.  Non-Medication Therapies  +  Around-the-Clock Non-Opioid Medications  +  Short-acting opioids  Opioids should be used with other medications to manage severe pain. Opioids block pain and give a feeling of euphoria (feel high). Addiction, a serious side effect of opioids, is rare with short-term (a few days) use.  Examples of short-acting opioids include: Tramadol (Ultram), Hydrocodone (Norco, Vicodin), Hydromorphone (Dilaudid), Oxycodone (Oxycontin)     The above directions have been adapted from the [employer] of [employer] Surgical Patient Education Program.  Please refer to the ACS website if needed: http://kaiser.net/   Ivar Drape, MD [facility] [place] [address], [address], [place], [place]  [address] ?  [address] [address], [place], [place]   [address] [phone] ? [phone] ? FAX [phone] Web site: [email or web address]   May take Tylenol and Ibuprofen after 2:30pm, if needed.   Post Anesthesia Home Care Instructions  Activity: Get plenty of rest for the remainder of the day. A  responsible individual must stay with you for 24 hours following the procedure.  For the next 24 hours, DO NOT: -Drive a car -Advertising copywriter -Drink alcoholic beverages -Take any medication unless instructed by your physician -Make any legal decisions or sign important papers.  Meals: Start with liquid foods such as gelatin or soup. Progress to regular foods as tolerated. Avoid greasy, spicy, heavy foods. If nausea and/or vomiting occur, drink only clear liquids until the nausea and/or vomiting subsides. Call your physician if vomiting continues.  Special Instructions/Symptoms: Your throat may feel dry or sore from the anesthesia or the breathing tube placed in your throat during surgery. If this causes discomfort, gargle with warm salt water. The discomfort should disappear within 24 hours.  If you had a scopolamine patch placed behind your ear for the management of post- operative nausea and/or vomiting:  1. The medication in the patch is effective for 72 hours, after which it should be removed.  Wrap patch in a tissue and discard in the trash. Wash hands thoroughly with soap and water. 2. You may remove the patch earlier than 72 hours if you experience unpleasant side effects which may include dry mouth, dizziness or visual disturbances. 3. Avoid touching the patch. Wash your hands with soap and water after contact with the patch.

## 2020-10-07 NOTE — Anesthesia Procedure Notes (Signed)
Procedure Name: Intubation Date/Time: 10/07/2020 9:48 AM Performed by: Maryella Shivers, CRNA Pre-anesthesia Checklist: Patient identified, Emergency Drugs available, Suction available and Patient being monitored Patient Re-evaluated:Patient Re-evaluated prior to induction Oxygen Delivery Method: Circle system utilized Preoxygenation: Pre-oxygenation with 100% oxygen Induction Type: IV induction Ventilation: Mask ventilation without difficulty Laryngoscope Size: Mac and 4 Grade View: Grade II Tube type: Oral Tube size: 8.0 mm Number of attempts: 1 Airway Equipment and Method: Stylet and Oral airway Placement Confirmation: ETT inserted through vocal cords under direct vision,  positive ETCO2 and breath sounds checked- equal and bilateral Secured at: 21 cm Tube secured with: Tape Dental Injury: Teeth and Oropharynx as per pre-operative assessment

## 2020-10-07 NOTE — Anesthesia Preprocedure Evaluation (Addendum)
Anesthesia Evaluation  Patient identified by MRN, date of birth, ID band Patient awake    Reviewed: Allergy & Precautions, H&P , NPO status , Patient's Chart, lab work & pertinent test results  Airway Mallampati: II  TM Distance: >3 FB Neck ROM: Full    Dental no notable dental hx.    Pulmonary neg pulmonary ROS, Patient abstained from smoking., former smoker,    Pulmonary exam normal breath sounds clear to auscultation       Cardiovascular negative cardio ROS Normal cardiovascular exam Rhythm:Regular Rate:Normal     Neuro/Psych negative neurological ROS  negative psych ROS   GI/Hepatic negative GI ROS, Neg liver ROS,   Endo/Other  negative endocrine ROSdiabetes, Well Controlled, Type 2, Oral Hypoglycemic Agents  Renal/GU negative Renal ROS  negative genitourinary   Musculoskeletal negative musculoskeletal ROS (+)   Abdominal Normal abdominal exam  (+)   Peds negative pediatric ROS (+)  Hematology negative hematology ROS (+)   Anesthesia Other Findings   Reproductive/Obstetrics negative OB ROS                            Anesthesia Physical Anesthesia Plan  ASA: II  Anesthesia Plan: General   Post-op Pain Management:    Induction: Intravenous  PONV Risk Score and Plan: 2 and Ondansetron, Dexamethasone and Midazolam  Airway Management Planned: Oral ETT  Additional Equipment:   Intra-op Plan:   Post-operative Plan: Extubation in OR  Informed Consent: I have reviewed the patients History and Physical, chart, labs and discussed the procedure including the risks, benefits and alternatives for the proposed anesthesia with the patient or authorized representative who has indicated his/her understanding and acceptance.     Dental advisory given  Plan Discussed with: CRNA and Anesthesiologist  Anesthesia Plan Comments:         Anesthesia Quick Evaluation

## 2020-10-07 NOTE — Op Note (Signed)
    Patient: Johnathan Morgan (1966/08/27, [id number])  Date of Surgery: 10/07/2020   Preoperative Diagnosis: UMBILICAL HERNIA    Postoperative Diagnosis: UMBILICAL HERNIA  Surgical Procedure: UMBILICAL HERNIA REPAIR WITH MESH:    Operative Team Members:  Surgeon(s) and Role:    * Johnathan Morgan, Johnathan Hopes, MD - Primary   Anesthesiologist: Johnathan Dance, MD CRNA: Johnathan Cash, CRNA   Anesthesia: General   Fluids:  Total I/O In: 100 [I.V.:100] Out: -   Complications: None  Drains:  none   Specimen: None   Disposition:  PACU - hemodynamically stable.  Plan of Care: Discharge to home after PACU  Indications for Procedure: Johnathan Morgan is a 55 y.o. male who presented with an umbilical hernia.  The procedure of umbilical hernia repair with mesh was recommended for the patient.  The procedure itself as well as its risk, benefits, and alternatives were discussed.  After full discussion all questions answered patient granted consent to proceed to the operating room.  Findings:  2 cm x 2 cm hernia defect closed transversely 6cm x 6cm ethicon ultrapro mesh placed in the preperitoneal space  Description of Procedure: On the date stated above patient taken the operating room suite and placed supine position.  General endotracheal anesthesia was induced.  A timeout was completed verifying the correct patient, procedure, positioning, and equipment needed for the case.  Antibiotics were given prior to the case start.  SCDs were placed on the lower extremities prior to the case start.  The patient's abdomen was prepped and draped in usual sterile fashion.  I began by making an infraumbilical incision and dissecting around the umbilical stalk I elevated the umbilical stalk and detach it from the abdominal wall fascia using electrocautery.  I then inspected the hernia defect.  I worked circumferentially around the hernia to divide its attachments to the subcutaneous tissues using electrocautery  and clean up the fascial edges.  The hernia appeared to contain only preperitoneal fat.  This was all reduced without entering the peritoneum.  The hernia was implicated using a 3-0 Vicryl suture.  I then worked to clear the preperitoneal space for mesh placement.  The fascial edges were grasped and elevated using a Kocher clamp.  I swept the preperitoneal fat off of the underside of the abdominal wall fascia until a sufficient space was created to lay our mesh.  At this point a piece of Ethicon ultra Pro mesh was brought on the field, hydrated, and cut to size.  A 6 cm x 6 cm piece was placed in the preperitoneal space and lay nicely filling the space and laying flat.  I then directed my attention to closure.  The fascia was closed using 0 Novafil with interrupted figure-of-eight sutures.  The fascial defect was closed transversely.  I then reattached the base the umbilical stalk to the abdominal fascia using 3-0 Vicryl suture.  The deep dermal layer was reapproximated using 3-0 Vicryl suture.  The skin was closed using subcuticular Monocryl suture.  Dermabond was applied over the incision.  A sponge was placed the umbilicus and a Tegaderm over the umbilicus and the air was sucked out of the sponge to provide an umbilical dressing.  The patient was awoken from anesthesia and transferred postanesthesia care unit in stable condition.   Johnathan Drape, MD General, Bariatric, & Minimally Invasive Surgery [facility], [place]

## 2020-10-08 ENCOUNTER — Encounter (HOSPITAL_BASED_OUTPATIENT_CLINIC_OR_DEPARTMENT_OTHER): Payer: Self-pay | Admitting: Surgery

## 2020-10-08 NOTE — Anesthesia Postprocedure Evaluation (Signed)
Anesthesia Post Note  Patient: Johnathan Morgan  Procedure(s) Performed: UMBILICAL HERNIA REPAIR WITH MESH (N/A Abdomen)     Patient location during evaluation: PACU Anesthesia Type: General Level of consciousness: awake Pain management: pain level controlled Vital Signs Assessment: post-procedure vital signs reviewed and stable Respiratory status: spontaneous breathing and respiratory function stable Cardiovascular status: stable Postop Assessment: no apparent nausea or vomiting Anesthetic complications: no   No complications documented.  Last Vitals:  Vitals:   10/07/20 1123 10/07/20 1134  BP: 136/90 (!) 133/99  Pulse: 88 84  Resp: 13 15  Temp:  36.7 C  SpO2: 96% 95%    Last Pain:  Vitals:   10/07/20 0828  TempSrc: Oral                 Mellody Dance

## 2022-04-28 ENCOUNTER — Other Ambulatory Visit: Payer: Self-pay | Admitting: Physician Assistant

## 2022-04-28 ENCOUNTER — Ambulatory Visit (INDEPENDENT_AMBULATORY_CARE_PROVIDER_SITE_OTHER): Payer: Self-pay

## 2022-04-28 DIAGNOSIS — M25512 Pain in left shoulder: Secondary | ICD-10-CM
